# Patient Record
Sex: Male | Born: 1985 | Hispanic: No | Marital: Single | Smoking: Never smoker
Health system: Southern US, Community
[De-identification: ages and names within clinical notes are randomized; demographics above are authoritative.]

## PROBLEM LIST (undated history)

## (undated) HISTORY — PX: HERNIA REPAIR: SHX51

---
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
## 2006-08-13 ENCOUNTER — Emergency Department (HOSPITAL_COMMUNITY): Admission: EM | Admit: 2006-08-13 | Discharge: 2006-08-13 | Payer: Self-pay | Admitting: Emergency Medicine

## 2012-10-21 ENCOUNTER — Emergency Department (INDEPENDENT_AMBULATORY_CARE_PROVIDER_SITE_OTHER)
Admission: EM | Admit: 2012-10-21 | Discharge: 2012-10-21 | Disposition: A | Discharge status: Home / Self Care | Payer: Self-pay | Source: Home / Self Care | Attending: Emergency Medicine | Admitting: Emergency Medicine

## 2012-10-21 ENCOUNTER — Encounter (HOSPITAL_COMMUNITY): Payer: Self-pay | Admitting: *Deleted

## 2012-10-21 DIAGNOSIS — M6740 Ganglion, unspecified site: Secondary | ICD-10-CM

## 2012-10-21 NOTE — ED Notes (Signed)
Pt  Has  A    Large   Cyst  On  l  Lower  Leg  Near  The  Ankle area  He  Reports it is  Recently  Is  Getting  Larger    He  Reports  yest  It became   Red and  Tender

## 2012-10-21 NOTE — ED Provider Notes (Signed)
Chief Complaint  Patient presents with  . Leg Swelling    History of Present Illness:   Joel Phillips is a 27 year old male who presents with a two-month history of a painless lump on his left, lateral ankle. This has not drained any fluid or pus. It's not been erythematous and he denies any swelling of the ankle, pain ankle joint per se, numbness, tingling, or weakness. He denies any trauma to the area.  Review of Systems:  Other than noted above, the patient denies any of the following symptoms: Systemic:  No fevers, chills, sweats, or aches.  No fatigue or tiredness. Musculoskeletal:  No joint pain, arthritis, bursitis, swelling, back pain, or neck pain. Neurological:  No muscular weakness, paresthesias, headache, or trouble with speech or coordination.  No dizziness.  PMFSH:  Past medical history, family history, social history, meds, and allergies were reviewed.  Physical Exam:   Vital signs:  BP 128/80  Pulse 79  Temp 98.1 F (36.7 C) (Oral)  Resp 18  SpO2 100% Gen:  Alert and oriented times 3.  In no distress. Musculoskeletal: There is a 3.5 cm raised, cystic lesion on the left lateral ankle just above and posterior to the lateral malleolus. This was nontender to touch it was not draining any pus.  Otherwise, all joints had a full a ROM with no swelling, bruising or deformity.  No edema, pulses full. Extremities were warm and pink.  Capillary refill was brisk.  Skin:  Clear, warm and dry.  No rash. Neuro:  Alert and oriented times 3.  Muscle strength was normal.  Sensation was intact to light touch.     Assessment:  The encounter diagnosis was Ganglion cyst.  This is so large, it'll need to be excised by an orthopedic surgeon.  Plan:   1.  The following meds were prescribed:   New Prescriptions   No medications on file   2.  The patient was instructed in symptomatic care, including rest and activity, elevation, application of ice and compression.  Appropriate handouts were  given. 3.  The patient was told to return if becoming worse in any way, if no better in 3 or 4 days, and given some red flag symptoms that would indicate earlier return.   4.  The patient was told to follow up with Dr. Mack Hook for surgical excision. Since the patient has no insurance, he will need to apply for an orange card first, then the followup with adult clinic to get referral.    [name], MD 10/21/12 1309

## 2014-03-29 ENCOUNTER — Emergency Department (HOSPITAL_COMMUNITY)
Admission: EM | Admit: 2014-03-29 | Discharge: 2014-03-29 | Disposition: A | Discharge status: Home / Self Care | Payer: BC Managed Care – PPO | Attending: Emergency Medicine | Admitting: Emergency Medicine

## 2014-03-29 ENCOUNTER — Encounter (HOSPITAL_COMMUNITY): Payer: Self-pay | Admitting: Emergency Medicine

## 2014-03-29 DIAGNOSIS — L509 Urticaria, unspecified: Secondary | ICD-10-CM | POA: Insufficient documentation

## 2014-03-29 DIAGNOSIS — T394X5A Adverse effect of antirheumatics, not elsewhere classified, initial encounter: Secondary | ICD-10-CM | POA: Insufficient documentation

## 2014-03-29 DIAGNOSIS — K130 Diseases of lips: Secondary | ICD-10-CM | POA: Insufficient documentation

## 2014-03-29 DIAGNOSIS — T7840XA Allergy, unspecified, initial encounter: Secondary | ICD-10-CM

## 2014-03-29 DIAGNOSIS — R221 Localized swelling, mass and lump, neck: Principal | ICD-10-CM

## 2014-03-29 DIAGNOSIS — R220 Localized swelling, mass and lump, head: Secondary | ICD-10-CM | POA: Insufficient documentation

## 2014-03-29 MED ORDER — FAMOTIDINE 20 MG PO TABS: 20.0000 mg | ORAL_TABLET | Freq: Two times a day (BID) | ORAL | Status: AC

## 2014-03-29 MED ORDER — PREDNISONE 10 MG PO TABS: 60.0000 mg | ORAL_TABLET | Freq: Every day | ORAL | Status: DC

## 2014-03-29 MED ORDER — METHYLPREDNISOLONE SODIUM SUCC 125 MG IJ SOLR
125.0000 mg | Freq: Once | INTRAMUSCULAR | Status: AC
  Administered 2014-03-29: 125 mg via INTRAVENOUS
  Filled 2014-03-29: qty 2

## 2014-03-29 MED ORDER — FAMOTIDINE 20 MG PO TABS
20.0000 mg | ORAL_TABLET | Freq: Once | ORAL | Status: AC
  Administered 2014-03-29: 20 mg via ORAL
  Filled 2014-03-29: qty 1

## 2014-03-29 MED ORDER — OXYCODONE HCL 5 MG PO TABS: 5.0000 mg | ORAL_TABLET | ORAL | Status: DC | PRN

## 2014-03-29 MED ORDER — DIPHENHYDRAMINE HCL 25 MG PO TABS: 25.0000 mg | ORAL_TABLET | Freq: Four times a day (QID) | ORAL | Status: AC

## 2014-03-29 MED ORDER — DIPHENHYDRAMINE HCL 50 MG/ML IJ SOLN
50.0000 mg | Freq: Once | INTRAMUSCULAR | Status: AC
  Administered 2014-03-29: 50 mg via INTRAVENOUS
  Filled 2014-03-29: qty 1

## 2014-03-29 NOTE — ED Notes (Signed)
Pt c/o swelling to lips and eyes along with small hives around body. Pt sts that he took aleve last night then woke up like this. Reports his mother told him this morning that he has allergies to ibuprofen but didn't know that before he took the medicine. sts that when he woke up he couldn't even open his eyes because they were so swollen, now he is able to open them all the way. No difficultly speaking or breathing, denies sob. Tongue doesn't look swollen. Nad, skin warm and dry, resp e/u.

## 2014-03-29 NOTE — ED Notes (Signed)
Per pt sts had foot surgery yesterday and when the anesthetic wore off he took some aleve. sts that he woke up this am with hives and facial swelling. Denies trouble breathing or swallowing.

## 2014-03-29 NOTE — ED Provider Notes (Signed)
CSN: [id number]     Arrival date & time 03/29/14  1229 History   First MD Initiated Contact with Patient 03/29/14 1242     Chief Complaint  Patient presents with  . Allergic Reaction      Patient is a 28 y.o. male presenting with allergic reaction.  Allergic Reaction  Patient presents to emergency department with swelling of his face and lips.  No difficulty breathing or swallowing.  No history of allergic reaction like this before.  He took Aleve for pain in his foot which had a recent surgery.  He states he had some hives on his hands into his body.  He's never had a reaction this severe.  Symptoms are mild to moderate in severity.  He has not tried any medication prior to arrival.  He feels as though the swelling around his eyes and on his face has improved somewhat since this morning.  He took the Naprosyn last night at 7 PM   History reviewed. No pertinent past medical history. History reviewed. No pertinent past surgical history. History reviewed. No pertinent family history. History  Substance Use Topics  . Smoking status: Never Smoker   . Smokeless tobacco: Not on file  . Alcohol Use: No    Review of Systems  All other systems reviewed and are negative.     Allergies  Motrin and Naproxen  Home Medications   Prior to Admission medications   Medication Sig Start Date End Date Taking? Authorizing Provider  diphenhydrAMINE (BENADRYL) 25 MG tablet Take 1 tablet (25 mg total) by mouth every 6 (six) hours. 03/29/14   Lyanne CoKevin M Viren Lebeau, MD  famotidine (PEPCID) 20 MG tablet Take 1 tablet (20 mg total) by mouth 2 (two) times daily. 03/29/14   Lyanne CoKevin M Terecia Plaut, MD  oxyCODONE (ROXICODONE) 5 MG immediate release tablet Take 1 tablet (5 mg total) by mouth every 4 (four) hours as needed for severe pain. 03/29/14   Lyanne CoKevin M Trashaun Streight, MD  predniSONE (DELTASONE) 10 MG tablet Take 6 tablets (60 mg total) by mouth daily. 03/29/14   Lyanne CoKevin M Damontre Millea, MD   BP 132/84  Pulse 63  Temp(Src) 98.1 F (36.7 C)  (Oral)  Resp 18  Ht 5\' 11"  (1.803 m)  Wt 250 lb (113.399 kg)  BMI 34.88 kg/m2  SpO2 97% Physical Exam  Nursing note and vitals reviewed. Constitutional: He is oriented to person, place, and time. He appears well-developed and well-nourished.  HENT:  Head: Normocephalic and atraumatic.  Mild periorbital edema.  Mild swelling of his face and his lips.  No swelling of his tongue.  No swelling of his posterior pharynx.  Tolerating secretions.  Oral airway patent.  Eyes: EOM are normal.  Neck: Normal range of motion. Neck supple.  Cardiovascular: Normal rate, regular rhythm, normal heart sounds and intact distal pulses.   Pulmonary/Chest: Effort normal and breath sounds normal. No stridor. No respiratory distress.  Abdominal: Soft. He exhibits no distension. There is no tenderness.  Musculoskeletal: Normal range of motion.  Neurological: He is alert and oriented to person, place, and time.  Skin: Skin is warm and dry.  Hives present  Psychiatric: He has a normal mood and affect. Judgment normal.    ED Course  Procedures (including critical care time) Labs Review Labs Reviewed - No data to display  Imaging Review No results found.   EKG Interpretation None      MDM   Final diagnoses:  Allergic reaction, initial encounter    Patient was  observed in the emergency department.  He appears to have allergic reaction.  It seems as though his swelling is improving already.  He has no airway compromise.  No signs of anaphylaxis.  This does appear to be a significant allergic reaction now.  I've informed the patient that he should not use Motrin, ibuprofen, Naprosyn.  He'll be discharged home with a prescription for oxycodone for pain.  The question is whether or not he will be a little tolerate Tylenol in the future.  I told him at this time he should avoid this and talk with his physician.  Home with Pepcid, prednisone, Benadryl.  He understands return to the ER for new or worsening  symptoms.    Lyanne CoKevin M Burnard Enis, MD 03/29/14 1440

## 2014-03-29 NOTE — Discharge Instructions (Signed)

## 2016-12-02 ENCOUNTER — Encounter (HOSPITAL_COMMUNITY): Payer: Self-pay

## 2016-12-02 ENCOUNTER — Emergency Department (HOSPITAL_COMMUNITY): Payer: BLUE CROSS/BLUE SHIELD

## 2016-12-02 ENCOUNTER — Emergency Department (HOSPITAL_COMMUNITY)
Admission: EM | Admit: 2016-12-02 | Discharge: 2016-12-02 | Disposition: A | Discharge status: Home / Self Care | Payer: BLUE CROSS/BLUE SHIELD | Attending: Emergency Medicine | Admitting: Emergency Medicine

## 2016-12-02 DIAGNOSIS — R0789 Other chest pain: Secondary | ICD-10-CM | POA: Insufficient documentation

## 2016-12-02 NOTE — ED Notes (Signed)
Called in lobby, no answer 

## 2016-12-02 NOTE — ED Notes (Signed)
Pt ambulated without difficulty to room. States now has left lower rib pain with movement. Denies cough.

## 2016-12-02 NOTE — ED Provider Notes (Signed)
[facility]-EMERGENCY DEPT Provider Note   CSN: [id number] Arrival date & time: 12/02/16  1355  By signing my name below, I, Joel Phillips, attest that this documentation has been prepared under the direction and in the presence of Terance HartKelly Jamea Robicheaux, PA-C . Electronically Signed: Majel HomerPeyton Phillips, Scribe. 12/02/2016. 4:39 PM.  History   Chief Complaint Chief Complaint  Patient presents with  . Chest Pain   The history is provided by the patient. No language interpreter was used.   HPI Comments: Joel Phillips is a 31 y.o. male who presents to the Emergency Department complaining of sudden onset, constant, "sharp," left-sided chest wall pain that began last night. Pt reports he was having a "coughing episode" last night when he suddenly experienced sharp pain to his left ribs that has persisted ever since. He notes his pain is exacerbated when taking a deep breath. He states he has not taken any medication to relieve his pain. Pt denies any fever, shortness of breath, and congestion.   History reviewed. No pertinent past medical history.  There are no active problems to display for this patient.  Past Surgical History:  Procedure Laterality Date  . HERNIA REPAIR      Home Medications    Prior to Admission medications   Medication Sig Start Date End Date Taking? Authorizing Provider  diphenhydrAMINE (BENADRYL) 25 MG tablet Take 1 tablet (25 mg total) by mouth every 6 (six) hours. 03/29/14   Azalia BilisKevin Campos, MD  famotidine (PEPCID) 20 MG tablet Take 1 tablet (20 mg total) by mouth 2 (two) times daily. 03/29/14   Azalia BilisKevin Campos, MD  oxyCODONE (ROXICODONE) 5 MG immediate release tablet Take 1 tablet (5 mg total) by mouth every 4 (four) hours as needed for severe pain. 03/29/14   Azalia BilisKevin Campos, MD  predniSONE (DELTASONE) 10 MG tablet Take 6 tablets (60 mg total) by mouth daily. 03/29/14   Azalia BilisKevin Campos, MD    Family History History reviewed. No pertinent family history.  Social History Social History  Substance Use  Topics  . Smoking status: Never Smoker  . Smokeless tobacco: Never Used  . Alcohol use No   Allergies   Motrin [ibuprofen] and Naproxen  Review of Systems Review of Systems  Constitutional: Negative for fever.  HENT: Negative for congestion.   Respiratory: Positive for cough. Negative for shortness of breath.   Musculoskeletal: Positive for arthralgias.   Physical Exam Updated Vital Signs BP 142/94 (BP Location: Left Arm)   Pulse 99   Temp 98.2 F (36.8 C) (Oral)   Resp 18   Ht 5\' 10"  (1.778 m)   Wt 250 lb (113.4 kg)   SpO2 100%   BMI 35.87 kg/m   Physical Exam  Constitutional: He is oriented to person, place, and time. He appears well-developed and well-nourished.  HENT:  Head: Normocephalic.  Eyes: EOM are normal.  Neck: Normal range of motion.  Cardiovascular: Normal rate and regular rhythm.   Pulmonary/Chest: Effort normal. He exhibits tenderness.  Point tenderness over the left lower anterior rib. Lungs clear to auscultation bilaterally.   Abdominal: He exhibits no distension.  Musculoskeletal: Normal range of motion.  Neurological: He is alert and oriented to person, place, and time.  Psychiatric: He has a normal mood and affect.  Nursing note and vitals reviewed.  ED Treatments / Results  DIAGNOSTIC STUDIES:  Oxygen Saturation is 100% on RA, normal by my interpretation.    COORDINATION OF CARE:  4:36 PM Discussed treatment plan with pt at bedside and pt  agreed to plan.  Labs (all labs ordered are listed, but only abnormal results are displayed) Labs Reviewed - No data to display  EKG  EKG Interpretation  Date/Time:  Tuesday December 02 2016 17:04:17 EDT Ventricular Rate:  88 PR Interval:  140 QRS Duration: 80 QT Interval:  356 QTC Calculation: 430 R Axis:   54 Text Interpretation:  Normal sinus rhythm Normal ECG No previous tracing Confirmed by Anitra Lauth  MD, Alphonzo Lemmings ([id number]) on 12/03/2016 9:08:29 PM       Radiology Dg Chest 2 View  Result  Date: 12/02/2016 CLINICAL DATA:  Cough and left chest pain EXAM: CHEST  2 VIEW COMPARISON:  07/22/2004 FINDINGS: The heart size and mediastinal contours are within normal limits. Both lungs are clear. The visualized skeletal structures are unremarkable. IMPRESSION: No active cardiopulmonary disease. Electronically Signed   By: Alcide Clever M.D.   On: 12/02/2016 14:42   Procedures Procedures (including critical care time)  Medications Ordered in ED Medications - No data to display  Initial Impression / Assessment and Plan / ED Course  I have reviewed the triage vital signs and the nursing notes.  Pertinent labs & imaging results that were available during my care of the patient were reviewed by me and considered in my medical decision making (see chart for details).  31 year old male with chest pain after coughing. Vitals are normal. CXR is negative. EKG is NSR. He cannot take antiinflammatories. Advised Tylenol and ice/heat. Return precautions given.  I personally performed the services described in this documentation, which was scribed in my presence. The recorded information has been reviewed and is accurate.   Final Clinical Impressions(s) / ED Diagnoses   Final diagnoses:  Chest wall pain    New Prescriptions New Prescriptions   No medications on file     Bethel Born, PA-C 12/05/16 1514    Mancel Bale, MD 12/06/16 [phone]

## 2016-12-02 NOTE — ED Triage Notes (Signed)
Per Pt, Pt reports having a coughing episode and then sharp pain in his left rib that has not subsided. Denies cough and congestion recently.

## 2016-12-02 NOTE — Discharge Instructions (Signed)
Take Tylenol as needed for pain You can put ice or heat on the area

## 2018-04-08 ENCOUNTER — Ambulatory Visit (HOSPITAL_COMMUNITY)
Admission: EM | Admit: 2018-04-08 | Discharge: 2018-04-08 | Disposition: A | Discharge status: Home / Self Care | Payer: BLUE CROSS/BLUE SHIELD

## 2018-04-08 NOTE — ED Notes (Addendum)
Patient is alert and oriented x 3 .  Patient answers questions appropriately.  Patient reports he passed out in kitchen at home.  Mother was a witness-not present at [facility].  Reports loc.  Patient has back pain.  Patient has NOT been working out in heat.  Patient has eaten, but reports changing his diet since Monday.  Patient is cutting out all sugar.  No history, no medications.  Patient has family members with him.    Spoke to dr Delton Seenelson.  Recommend ED.  Patient agreeable

## 2018-07-09 IMAGING — DX DG CHEST 2V
2 series · 2 of 2 positions shown · non-contrast
Comparison: 07/22/2004

CLINICAL DATA: Cough and left chest pain

EXAM:
CHEST  2 VIEW

[chest pa]
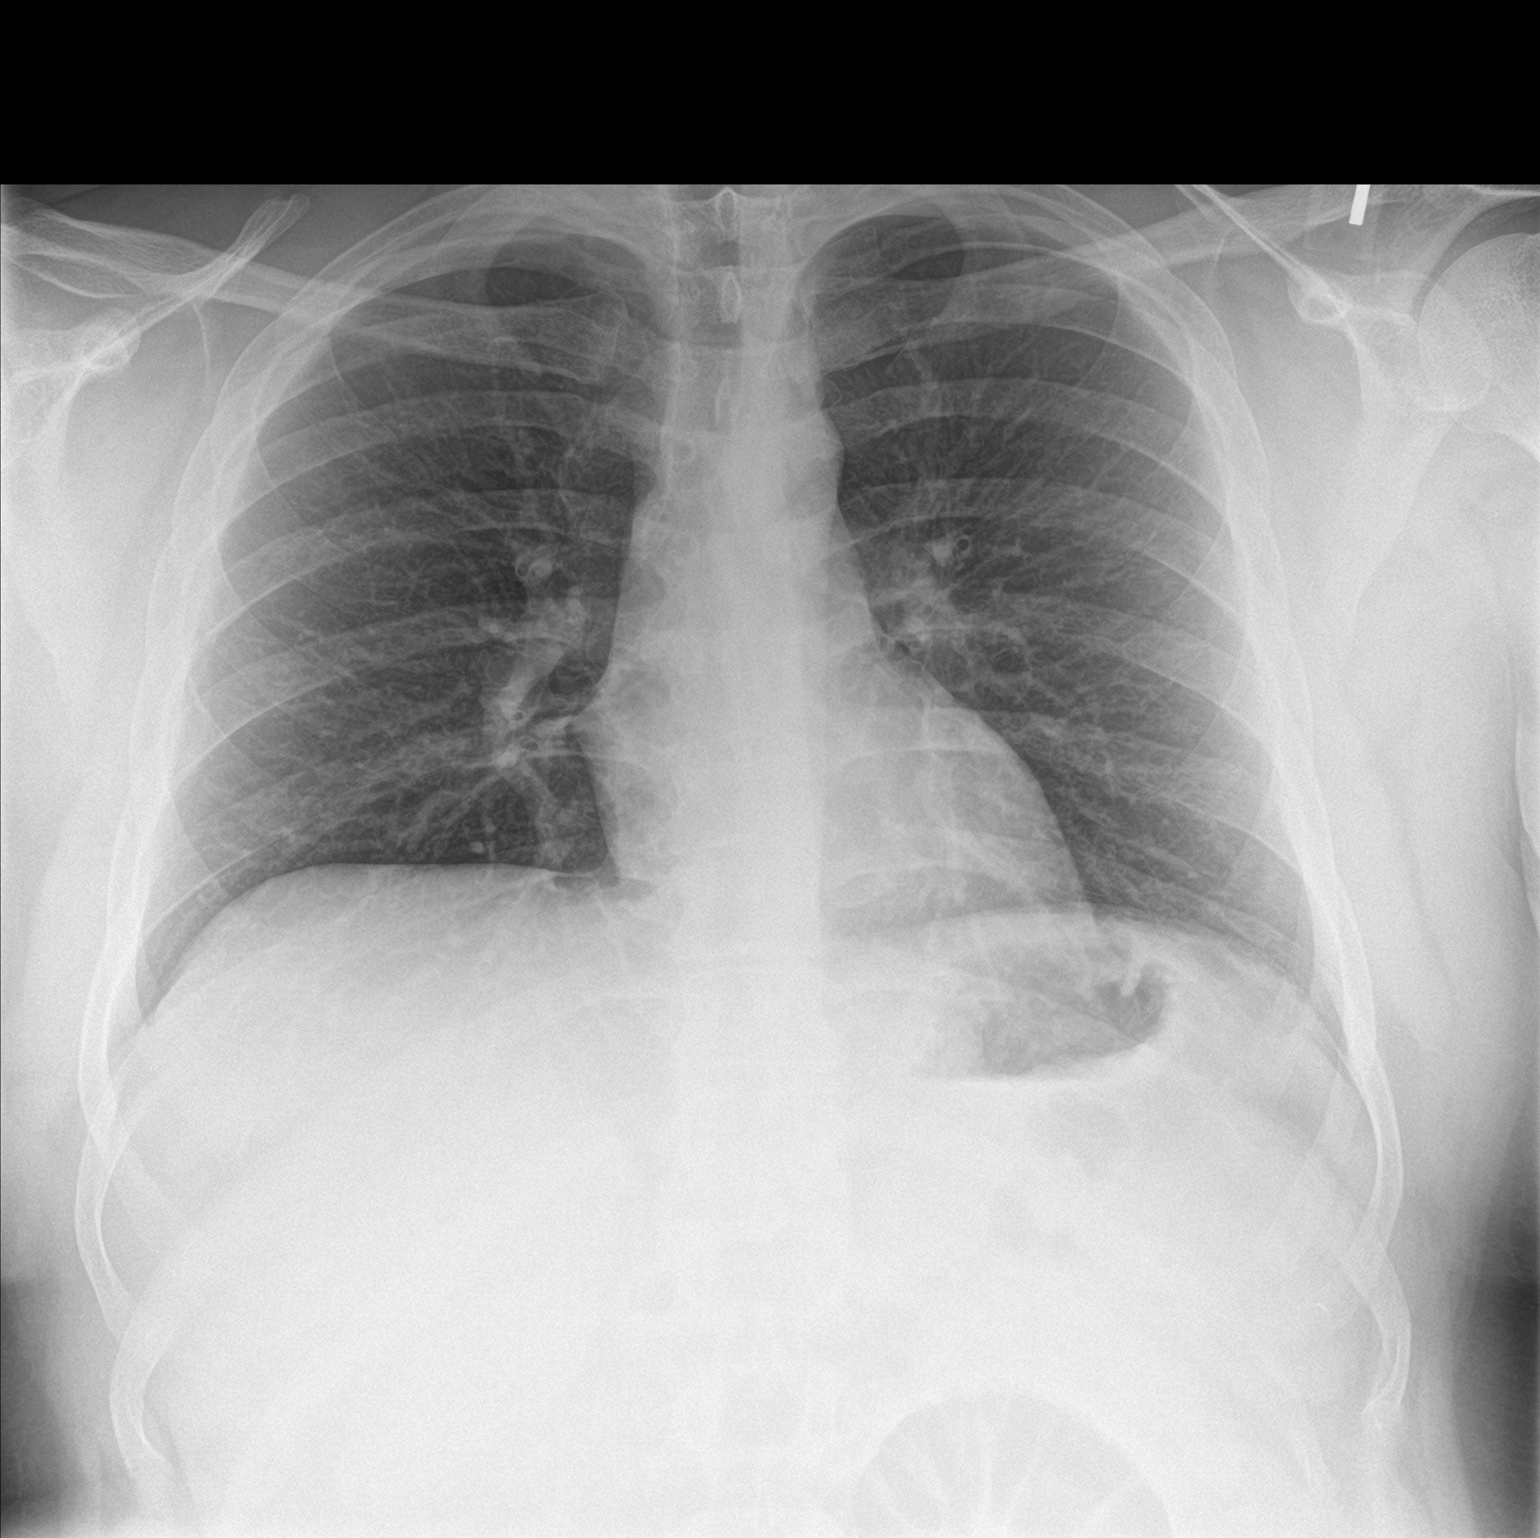

[chest lat]
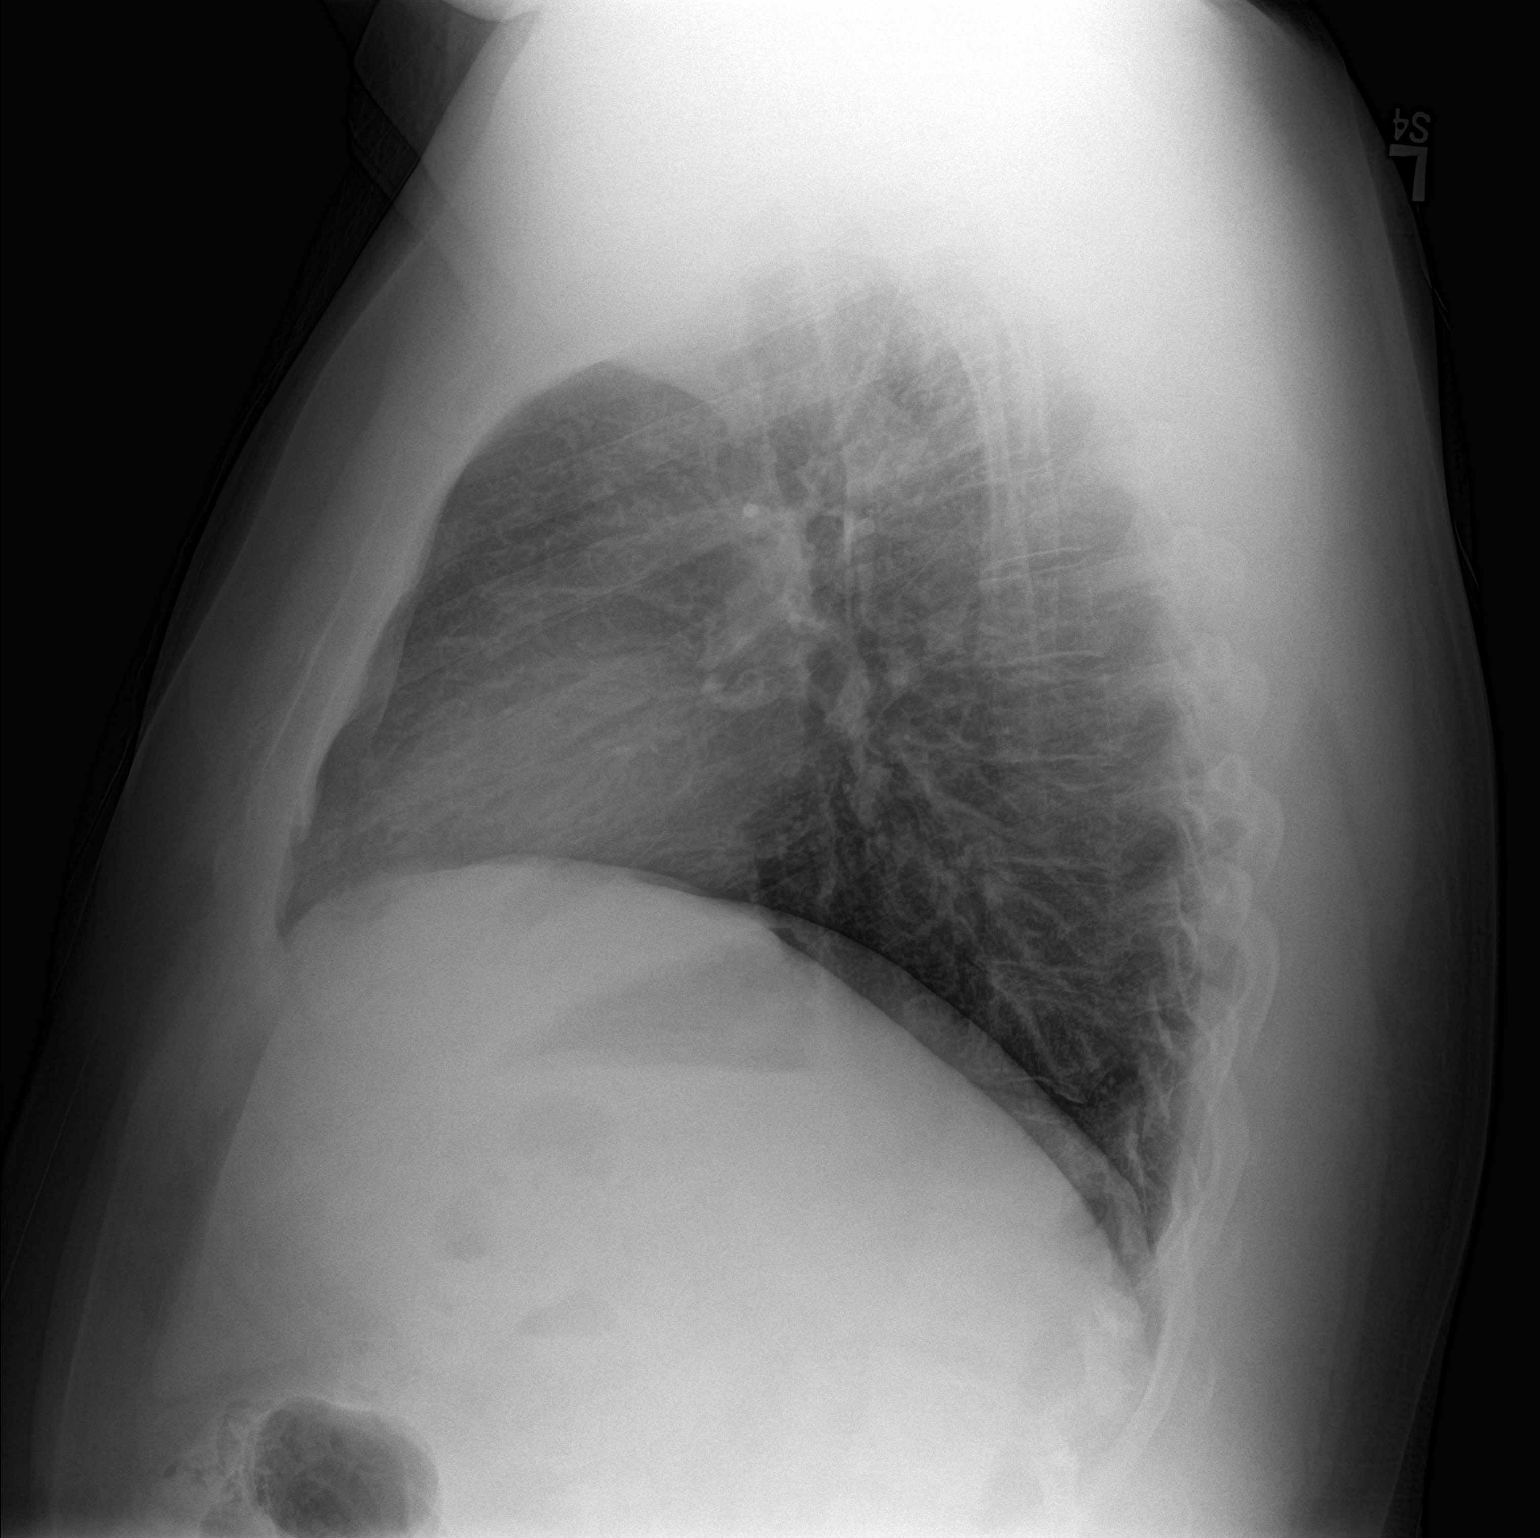

[2 of 2 positions shown; findings below may reference images not displayed]

FINDINGS: The heart size and mediastinal contours are within normal limits.
Both lungs are clear. The visualized skeletal structures are
unremarkable.
IMPRESSION: No active cardiopulmonary disease.
